# Patient Record
Sex: Male | Born: 1985 | Race: Black or African American | Hispanic: No | Marital: Single | Smoking: Current every day smoker
Health system: Southern US, Community
[De-identification: ages and names within clinical notes are randomized; demographics above are authoritative.]

---

## 2017-07-17 ENCOUNTER — Emergency Department
Admission: EM | Admit: 2017-07-17 | Discharge: 2017-07-17 | Disposition: A | Discharge status: Home / Self Care | Payer: No Typology Code available for payment source | Attending: Emergency Medicine | Admitting: Emergency Medicine

## 2017-07-17 ENCOUNTER — Emergency Department: Payer: No Typology Code available for payment source

## 2017-07-17 ENCOUNTER — Other Ambulatory Visit: Payer: Self-pay

## 2017-07-17 ENCOUNTER — Encounter: Payer: Self-pay | Admitting: Emergency Medicine

## 2017-07-17 DIAGNOSIS — Y92410 Unspecified street and highway as the place of occurrence of the external cause: Secondary | ICD-10-CM | POA: Insufficient documentation

## 2017-07-17 DIAGNOSIS — Y998 Other external cause status: Secondary | ICD-10-CM | POA: Diagnosis not present

## 2017-07-17 DIAGNOSIS — F17200 Nicotine dependence, unspecified, uncomplicated: Secondary | ICD-10-CM | POA: Insufficient documentation

## 2017-07-17 DIAGNOSIS — M545 Low back pain, unspecified: Secondary | ICD-10-CM

## 2017-07-17 DIAGNOSIS — S39012A Strain of muscle, fascia and tendon of lower back, initial encounter: Secondary | ICD-10-CM | POA: Insufficient documentation

## 2017-07-17 DIAGNOSIS — Y939 Activity, unspecified: Secondary | ICD-10-CM | POA: Diagnosis not present

## 2017-07-17 DIAGNOSIS — S3992XA Unspecified injury of lower back, initial encounter: Secondary | ICD-10-CM | POA: Diagnosis present

## 2017-07-17 DIAGNOSIS — T148XXA Other injury of unspecified body region, initial encounter: Secondary | ICD-10-CM

## 2017-07-17 MED ORDER — CYCLOBENZAPRINE HCL 10 MG PO TABS: 10.0000 mg | ORAL_TABLET | Freq: Three times a day (TID) | ORAL | 0 refills | Status: AC | PRN

## 2017-07-17 MED ORDER — NAPROXEN 500 MG PO TABS: 500.0000 mg | ORAL_TABLET | Freq: Two times a day (BID) | ORAL | 0 refills | Status: AC
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
## 2021-10-30 ENCOUNTER — Emergency Department
Admission: EM | Admit: 2021-10-30 | Discharge: 2021-10-30 | Disposition: A | Discharge status: Other Acute Inpatient Hospital | Payer: Medicaid Other | Attending: Emergency Medicine | Admitting: Emergency Medicine

## 2021-10-30 ENCOUNTER — Emergency Department: Payer: Medicaid Other

## 2021-10-30 ENCOUNTER — Other Ambulatory Visit: Payer: Self-pay

## 2021-10-30 DIAGNOSIS — S32691A Other specified fracture of right ischium, initial encounter for closed fracture: Secondary | ICD-10-CM | POA: Diagnosis not present

## 2021-10-30 DIAGNOSIS — Z23 Encounter for immunization: Secondary | ICD-10-CM | POA: Diagnosis not present

## 2021-10-30 DIAGNOSIS — W3400XA Accidental discharge from unspecified firearms or gun, initial encounter: Secondary | ICD-10-CM | POA: Insufficient documentation

## 2021-10-30 DIAGNOSIS — S3991XA Unspecified injury of abdomen, initial encounter: Secondary | ICD-10-CM | POA: Insufficient documentation

## 2021-10-30 DIAGNOSIS — S299XXA Unspecified injury of thorax, initial encounter: Secondary | ICD-10-CM | POA: Insufficient documentation

## 2021-10-30 DIAGNOSIS — S31813A Puncture wound without foreign body of right buttock, initial encounter: Secondary | ICD-10-CM | POA: Diagnosis not present

## 2021-10-30 DIAGNOSIS — Y906 Blood alcohol level of 120-199 mg/100 ml: Secondary | ICD-10-CM | POA: Insufficient documentation

## 2021-10-30 DIAGNOSIS — R791 Abnormal coagulation profile: Secondary | ICD-10-CM | POA: Insufficient documentation

## 2021-10-30 DIAGNOSIS — S71101A Unspecified open wound, right thigh, initial encounter: Secondary | ICD-10-CM | POA: Insufficient documentation

## 2021-10-30 DIAGNOSIS — Z20822 Contact with and (suspected) exposure to covid-19: Secondary | ICD-10-CM | POA: Diagnosis not present

## 2021-10-30 DIAGNOSIS — S31823A Puncture wound without foreign body of left buttock, initial encounter: Secondary | ICD-10-CM | POA: Diagnosis not present

## 2021-10-30 DIAGNOSIS — S80811A Abrasion, right lower leg, initial encounter: Secondary | ICD-10-CM | POA: Diagnosis not present

## 2021-10-30 DIAGNOSIS — Z1810 Retained metal fragments, unspecified: Secondary | ICD-10-CM | POA: Diagnosis not present

## 2021-10-30 DIAGNOSIS — Z043 Encounter for examination and observation following other accident: Secondary | ICD-10-CM | POA: Diagnosis not present

## 2021-10-30 DIAGNOSIS — S32601A Unspecified fracture of right ischium, initial encounter for closed fracture: Secondary | ICD-10-CM | POA: Diagnosis not present

## 2021-10-30 DIAGNOSIS — S79921A Unspecified injury of right thigh, initial encounter: Secondary | ICD-10-CM | POA: Diagnosis present

## 2021-10-30 DIAGNOSIS — S71141A Puncture wound with foreign body, right thigh, initial encounter: Secondary | ICD-10-CM | POA: Diagnosis not present

## 2021-10-30 LAB — COMPREHENSIVE METABOLIC PANEL
ALT: 42 U/L (ref 0–44)
AST: 33 U/L (ref 15–41)
Albumin: 4 g/dL (ref 3.5–5.0)
Alkaline Phosphatase: 60 U/L (ref 38–126)
Anion gap: 15 (ref 5–15)
BUN: 9 mg/dL (ref 6–20)
CO2: 19 mmol/L — ABNORMAL LOW (ref 22–32)
Calcium: 8.8 mg/dL — ABNORMAL LOW (ref 8.9–10.3)
Chloride: 108 mmol/L (ref 98–111)
Creatinine, Ser: 1.22 mg/dL (ref 0.61–1.24)
GFR, Estimated: >60 mL/min (ref >60)
Glucose, Bld: 197 mg/dL — ABNORMAL HIGH (ref 70–99)
Potassium: 3.2 mmol/L — ABNORMAL LOW (ref 3.5–5.1)
Sodium: 142 mmol/L (ref 135–145)
Total Bilirubin: 0.6 mg/dL (ref 0.3–1.2)
Total Protein: 7.4 g/dL (ref 6.5–8.1)

## 2021-10-30 LAB — BPAM FFP
Blood Product Expiration Date: 202310042359
Blood Product Expiration Date: 202310042359
ISSUE DATE / TIME: 202309290238
ISSUE DATE / TIME: 202309290238
Unit Type and Rh: 8400
Unit Type and Rh: 8400

## 2021-10-30 LAB — CBC
HCT: 39.2 % (ref 39.0–52.0)
Hemoglobin: 12.7 g/dL — ABNORMAL LOW (ref 13.0–17.0)
MCH: 31.8 pg (ref 26.0–34.0)
MCHC: 32.4 g/dL (ref 30.0–36.0)
MCV: 98 fL (ref 80.0–100.0)
Platelets: 242 10*3/uL (ref 150–400)
RBC: 4 MIL/uL — ABNORMAL LOW (ref 4.22–5.81)
RDW: 11.9 % (ref 11.5–15.5)
WBC: 7.4 10*3/uL (ref 4.0–10.5)
nRBC: 0 % (ref 0.0–0.2)

## 2021-10-30 LAB — TYPE AND SCREEN
ABO/RH(D): AB POS
Antibody Screen: NEGATIVE
Unit division: 0

## 2021-10-30 LAB — LACTIC ACID, PLASMA
Lactic Acid, Venous: 6.8 mmol/L (ref 0.5–1.9)
Lactic Acid, Venous: 6.4 mmol/L (ref 0.5–1.9)

## 2021-10-30 LAB — PREPARE FRESH FROZEN PLASMA
Unit division: 0
Unit division: 0

## 2021-10-30 LAB — ABO/RH: ABO/RH(D): AB POS

## 2021-10-30 LAB — RESP PANEL BY RT-PCR (FLU A&B, COVID) ARPGX2
Influenza A by PCR: NEGATIVE
Influenza B by PCR: NEGATIVE
SARS Coronavirus 2 by RT PCR: NEGATIVE

## 2021-10-30 LAB — PROTIME-INR
INR: 1.1 (ref 0.8–1.2)
Prothrombin Time: 13.6 seconds (ref 11.4–15.2)

## 2021-10-30 LAB — PREPARE RBC (CROSSMATCH)

## 2021-10-30 LAB — ETHANOL: Alcohol, Ethyl (B): 144 mg/dL — ABNORMAL HIGH (ref <10)

## 2021-10-30 MED ORDER — FENTANYL CITRATE PF 50 MCG/ML IJ SOSY: 100.0000 ug | PREFILLED_SYRINGE | Freq: Once | INTRAMUSCULAR | Status: AC

## 2021-10-30 MED ORDER — FENTANYL CITRATE PF 50 MCG/ML IJ SOSY
PREFILLED_SYRINGE | INTRAMUSCULAR | Status: AC
  Administered 2021-10-30: 100 ug via INTRAVENOUS
  Filled 2021-10-30: qty 2

## 2021-10-30 MED ORDER — CEFAZOLIN SODIUM-DEXTROSE 1-4 GM/50ML-% IV SOLN
1.0000 g | Freq: Once | INTRAVENOUS | Status: DC
  Filled 2021-10-30: qty 50

## 2021-10-30 MED ORDER — TETANUS-DIPHTH-ACELL PERTUSSIS 5-2.5-18.5 LF-MCG/0.5 IM SUSY
PREFILLED_SYRINGE | INTRAMUSCULAR | Status: AC
  Administered 2021-10-30: 0.5 mL via INTRAMUSCULAR
  Filled 2021-10-30: qty 0.5

## 2021-10-30 MED ORDER — NOREPINEPHRINE 4 MG/250ML-% IV SOLN: 0.0000 ug/min | INTRAVENOUS | Status: DC

## 2021-10-30 MED ORDER — FENTANYL CITRATE PF 50 MCG/ML IJ SOSY
100.0000 ug | PREFILLED_SYRINGE | Freq: Once | INTRAMUSCULAR | Status: AC
  Administered 2021-10-30: 100 ug via INTRAVENOUS
  Filled 2021-10-30: qty 2

## 2021-10-30 MED ORDER — NOREPINEPHRINE 4 MG/250ML-% IV SOLN
INTRAVENOUS | Status: AC
  Administered 2021-10-30: 20 ug/min via INTRAVENOUS
  Filled 2021-10-30: qty 250

## 2021-10-30 MED ORDER — SUCCINYLCHOLINE CHLORIDE 200 MG/10ML IV SOSY
PREFILLED_SYRINGE | INTRAVENOUS | Status: AC
  Filled 2021-10-30: qty 10

## 2021-10-30 MED ORDER — ETOMIDATE 2 MG/ML IV SOLN
INTRAVENOUS | Status: AC
  Filled 2021-10-30: qty 10

## 2021-10-30 MED ORDER — IOHEXOL 350 MG/ML SOLN
125.0000 mL | Freq: Once | INTRAVENOUS | Status: AC | PRN
  Administered 2021-10-30: 125 mL via INTRAVENOUS

## 2021-10-30 MED ORDER — SODIUM CHLORIDE 0.9% IV SOLUTION
Freq: Once | INTRAVENOUS | Status: DC
  Filled 2021-10-30: qty 250

## 2021-10-30 MED ORDER — TETANUS-DIPHTH-ACELL PERTUSSIS 5-2.5-18.5 LF-MCG/0.5 IM SUSY: 0.5000 mL | PREFILLED_SYRINGE | Freq: Once | INTRAMUSCULAR | Status: AC
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
## 2021-10-30 NOTE — ED Notes (Signed)
Issue with scanning the FFP - barcodes not matching. Charge RN, Dr. Jacelyn Grip and blood bank aware.
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
## 2021-10-30 NOTE — ED Notes (Signed)
Transported to ct 

## 2021-10-30 NOTE — ED Notes (Signed)
Dr Jacelyn Grip informed of lactic acid of 6.8
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
## 2021-10-30 NOTE — ED Notes (Signed)
Tourniqet applied to right upper thigh by dr Jacelyn Grip

## 2021-10-30 NOTE — ED Notes (Signed)
HR decreased to 45, zoll pads applied and pt currently being monitored by zoll. + carotid pulse. Pt is very diaphoretic

## 2021-10-30 NOTE — ED Notes (Signed)
FAST EXAM NEGATIVE

## 2021-10-31 DIAGNOSIS — S8991XA Unspecified injury of right lower leg, initial encounter: Secondary | ICD-10-CM | POA: Diagnosis not present

## 2021-10-31 DIAGNOSIS — R937 Abnormal findings on diagnostic imaging of other parts of musculoskeletal system: Secondary | ICD-10-CM | POA: Diagnosis not present

## 2021-10-31 DIAGNOSIS — S32691A Other specified fracture of right ischium, initial encounter for closed fracture: Secondary | ICD-10-CM | POA: Diagnosis not present

## 2021-10-31 DIAGNOSIS — S31819A Unspecified open wound of right buttock, initial encounter: Secondary | ICD-10-CM | POA: Diagnosis not present

## 2021-10-31 DIAGNOSIS — S71141A Puncture wound with foreign body, right thigh, initial encounter: Secondary | ICD-10-CM | POA: Diagnosis not present

## 2021-10-31 DIAGNOSIS — S31813A Puncture wound without foreign body of right buttock, initial encounter: Secondary | ICD-10-CM | POA: Diagnosis not present

## 2021-11-01 LAB — BPAM RBC
Blood Product Expiration Date: 202311042359
Blood Product Expiration Date: 202311042359
ISSUE DATE / TIME: 202309290135
ISSUE DATE / TIME: 202309290135
Unit Type and Rh: 5100
Unit Type and Rh: 5100

## 2021-11-01 LAB — TYPE AND SCREEN: Unit division: 0

## 2021-11-02 DIAGNOSIS — F17200 Nicotine dependence, unspecified, uncomplicated: Secondary | ICD-10-CM | POA: Insufficient documentation

## 2021-11-04 ENCOUNTER — Ambulatory Visit: Payer: Self-pay | Admitting: *Deleted
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
## 2022-03-02 ENCOUNTER — Ambulatory Visit: Payer: Medicaid Other | Admitting: Physician Assistant

## 2022-04-08 ENCOUNTER — Ambulatory Visit: Payer: Medicaid Other | Admitting: Physician Assistant

## 2022-04-08 NOTE — Progress Notes (Deleted)
   Argentina Ponder Earmon Sherrow,acting as a Education administrator for [employer], PA-C.,have documented all relevant documentation on the behalf of Mardene Speak, PA-C,as directed by  [employer], PA-C while in the presence of [employer], PA-C.   New patient visit   Patient: Alex Blair   DOB: 11-05-1985   37 y.o. Male  MRN: [id number] Visit Date: 04/08/2022  Today's healthcare provider: Mardene Speak, PA-C   No chief complaint on file.  Subjective    Alex Blair is a 37 y.o. male who presents today as a new patient to establish care.  HPI  ***  No past medical history on file. No past surgical history on file. No family status information on file.   No family history on file. Social History   Socioeconomic History   Marital status: Single    Spouse name: Not on file   Number of children: Not on file   Years of education: Not on file   Highest education level: Not on file  Occupational History   Not on file  Tobacco Use   Smoking status: Every Day   Smokeless tobacco: Never  Substance and Sexual Activity   Alcohol use: Not Currently   Drug use: Not on file   Sexual activity: Not on file  Other Topics Concern   Not on file  Social History Narrative   Not on file   Social Determinants of Health   Financial Resource Strain: Not on file  Food Insecurity: Not on file  Transportation Needs: Not on file  Physical Activity: Not on file  Stress: Not on file  Social Connections: Not on file   Outpatient Medications Prior to Visit  Medication Sig   cyclobenzaprine (FLEXERIL) 10 MG tablet Take 1 tablet (10 mg total) by mouth 3 (three) times daily as needed for muscle spasms.   naproxen (NAPROSYN) 500 MG tablet Take 1 tablet (500 mg total) by mouth 2 (two) times daily with a meal.   No facility-administered medications prior to visit.   No Known Allergies  Immunization History  Administered Date(s) Administered   Tdap 10/30/2021    Health Maintenance  Topic Date Due    COVID-19 Vaccine (1) Never done   HIV Screening  Never done   Hepatitis C Screening  Never done   INFLUENZA VACCINE  Never done   DTaP/Tdap/Td (2 - Td or Tdap) 10/31/2031   HPV VACCINES  Aged Out    Patient Care Team: Pcp, No as PCP - General  Review of Systems  Constitutional: Negative.   HENT: Negative.    Eyes: Negative.   Respiratory: Negative.    Cardiovascular: Negative.   Gastrointestinal: Negative.   Endocrine: Negative.   Genitourinary: Negative.   Musculoskeletal: Negative.   Skin: Negative.   Allergic/Immunologic: Negative.   Neurological: Negative.   Hematological: Negative.   Psychiatric/Behavioral: Negative.      {Labs  Heme  Chem  Endocrine  Serology  Results Review (optional):23779}   Objective    There were no vitals taken for this visit. {Show previous vital signs (optional):23777}  Physical Exam ***  Depression Screen     No data to display         No results found for any visits on 04/08/22.  Assessment & Plan     ***  No follow-ups on file.     {provider attestation***:1}   Mardene Speak, PA-C  [place] [phone] (phone) [phone] (fax)  [place]

## 2023-04-12 ENCOUNTER — Ambulatory Visit: Payer: Medicaid Other | Admitting: Pediatrics

## 2023-07-26 ENCOUNTER — Ambulatory Visit: Admitting: Pediatrics

## 2023-08-06 NOTE — Patient Instructions (Incomplete)

## 2023-08-15 ENCOUNTER — Ambulatory Visit: Admitting: Nurse Practitioner

## 2023-10-30 NOTE — Patient Instructions (Incomplete)
 Healthy Eating, Adult Healthy eating may help you get and keep a healthy body weight, reduce the risk of chronic disease, and live a long and productive life. It is important to follow a healthy eating pattern. Your nutritional and calorie needs should be met mainly by different nutrient-rich foods. What are tips for following this plan? Reading food labels Read labels and choose the following: Reduced or low sodium products. Juices with 100% fruit juice. Foods with low saturated fats (<3 g per serving) and high polyunsaturated and monounsaturated fats. Foods with whole grains, such as whole wheat, cracked wheat, brown rice, and wild rice. Whole grains that are fortified with folic acid . This is recommended for females who are pregnant or who want to become pregnant. Read labels and do not eat or drink the following: Foods or drinks with added sugars. These include foods that contain brown sugar, corn sweetener, corn syrup, dextrose , fructose, glucose, high-fructose corn syrup, honey, invert sugar, lactose, malt syrup, maltose, molasses, raw sugar, sucrose, trehalose, or turbinado sugar. Limit your intake of added sugars to less than 10% of your total daily calories. Do not eat more than the following amounts of added sugar per day: 6 teaspoons (25 g) for females. 9 teaspoons (38 g) for males. Foods that contain processed or refined starches and grains. Refined grain products, such as white flour, degermed cornmeal, white bread, and white rice. Shopping Choose nutrient-rich snacks, such as vegetables, whole fruits, and nuts. Avoid high-calorie and high-sugar snacks, such as potato chips, fruit snacks, and candy. Use oil-based dressings and spreads on foods instead of solid fats such as butter, margarine, sour cream, or cream cheese. Limit pre-made sauces, mixes, and instant products such as flavored rice, instant noodles, and ready-made pasta. Try more plant-protein sources, such as tofu,  tempeh, black beans, edamame, lentils, nuts, and seeds. Explore eating plans such as the Mediterranean diet or vegetarian diet. Try heart-healthy dips made with beans and healthy fats like hummus and guacamole. Vegetables go great with these. Cooking Use oil to saut or stir-fry foods instead of solid fats such as butter, margarine, or lard. Try baking, boiling, grilling, or broiling instead of frying. Remove the fatty part of meats before cooking. Steam vegetables in water  or broth. Meal planning  At meals, imagine dividing your plate into fourths: One-half of your plate is fruits and vegetables. One-fourth of your plate is whole grains. One-fourth of your plate is protein, especially lean meats, poultry, eggs, tofu, beans, or nuts. Include low-fat dairy as part of your daily diet. Lifestyle Choose healthy options in all settings, including home, work, school, restaurants, or stores. Prepare your food safely: Wash your hands after handling raw meats. Where you prepare food, keep surfaces clean by regularly washing with hot, soapy water . Keep raw meats separate from ready-to-eat foods, such as fruits and vegetables. Cook seafood, meat, poultry, and eggs to the recommended temperature. Get a food thermometer. Store foods at safe temperatures. In general: Keep cold foods at 34F (4.4C) or below. Keep hot foods at 134F (60C) or above. Keep your freezer at [facility] (-17.8C) or below. Foods are not safe to eat if they have been between the temperatures of 40-134F (4.4-60C) for more than 2 hours. What foods should I eat? Fruits Aim to eat 1-2 cups of fresh, canned (in natural juice), or frozen fruits each day. One cup of fruit equals 1 small apple, 1 large banana, 8 large strawberries, 1 cup (237 g) canned fruit,  cup (82 g) dried fruit,  or 1 cup (240 mL) 100% juice. Vegetables Aim to eat 2-4 cups of fresh and frozen vegetables each day, including different varieties and colors. One cup  of vegetables equals 1 cup (91 g) broccoli or cauliflower florets, 2 medium carrots, 2 cups (150 g) raw, leafy greens, 1 large tomato, 1 large bell pepper, 1 large sweet potato, or 1 medium white potato. Grains Aim to eat 5-10 ounce-equivalents of whole grains each day. Examples of 1 ounce-equivalent of grains include 1 slice of bread, 1 cup (40 g) ready-to-eat cereal, 3 cups (24 g) popcorn, or  cup (93 g) cooked rice. Meats and other proteins Try to eat 5-7 ounce-equivalents of protein each day. Examples of 1 ounce-equivalent of protein include 1 egg,  oz nuts (12 almonds, 24 pistachios, or 7 walnut halves), 1/4 cup (90 g) cooked beans, 6 tablespoons (90 g) hummus or 1 tablespoon (16 g) peanut butter. A cut of meat or fish that is the size of a deck of cards is about 3-4 ounce-equivalents (85 g). Of the protein you eat each week, try to have at least 8 sounce (227 g) of seafood. This is about 2 servings per week. This includes salmon, trout, herring, sardines, and anchovies. Dairy Aim to eat 3 cup-equivalents of fat-free or low-fat dairy each day. Examples of 1 cup-equivalent of dairy include 1 cup (240 mL) milk, 8 ounces (250 g) yogurt, 1 ounces (44 g) natural cheese, or 1 cup (240 mL) fortified soy milk. Fats and oils Aim for about 5 teaspoons (21 g) of fats and oils per day. Choose monounsaturated fats, such as canola and olive oils, mayonnaise made with olive oil or avocado oil, avocados, peanut butter, and most nuts, or polyunsaturated fats, such as sunflower, corn, and soybean oils, walnuts, pine nuts, sesame seeds, sunflower seeds, and flaxseed. Beverages Aim for 6 eight-ounce glasses of water  per day. Limit coffee to 3-5 eight-ounce cups per day. Limit caffeinated beverages that have added calories, such as soda and energy drinks. If you drink alcohol: Limit how much you have to: 0-1 drink a day if you are male. 0-2 drinks a day if you are male. Know how much alcohol is in your drink.  In the [place] one drink is one 12 oz bottle of beer (355 mL), one 5 oz glass of wine (148 mL), or one 1 oz glass of hard liquor (44 mL). Seasoning and other foods Try not to add too much salt to your food. Try using herbs and spices instead of salt. Try not to add sugar to food. This information is based on [place] nutrition guidelines. To learn more, visit [employer]. Exact amounts may vary. You may need different amounts. This information is not intended to replace advice given to you by your health care provider. Make sure you discuss any questions you have with your health care provider. Document Revised: 10/19/2021 Document Reviewed: 10/19/2021 [employer] Patient Education  2024 [employer].

## 2023-11-02 ENCOUNTER — Ambulatory Visit: Admitting: Nurse Practitioner

## 2023-12-16 NOTE — Patient Instructions (Incomplete)
 Healthy Eating, Adult Healthy eating may help you get and keep a healthy body weight, reduce the risk of chronic disease, and live a long and productive life. It is important to follow a healthy eating pattern. Your nutritional and calorie needs should be met mainly by different nutrient-rich foods. What are tips for following this plan? Reading food labels Read labels and choose the following: Reduced or low sodium products. Juices with 100% fruit juice. Foods with low saturated fats (<3 g per serving) and high polyunsaturated and monounsaturated fats. Foods with whole grains, such as whole wheat, cracked wheat, brown rice, and wild rice. Whole grains that are fortified with folic acid . This is recommended for females who are pregnant or who want to become pregnant. Read labels and do not eat or drink the following: Foods or drinks with added sugars. These include foods that contain brown sugar, corn sweetener, corn syrup, dextrose , fructose, glucose, high-fructose corn syrup, honey, invert sugar, lactose, malt syrup, maltose, molasses, raw sugar, sucrose, trehalose, or turbinado sugar. Limit your intake of added sugars to less than 10% of your total daily calories. Do not eat more than the following amounts of added sugar per day: 6 teaspoons (25 g) for females. 9 teaspoons (38 g) for males. Foods that contain processed or refined starches and grains. Refined grain products, such as white flour, degermed cornmeal, white bread, and white rice. Shopping Choose nutrient-rich snacks, such as vegetables, whole fruits, and nuts. Avoid high-calorie and high-sugar snacks, such as potato chips, fruit snacks, and candy. Use oil-based dressings and spreads on foods instead of solid fats such as butter, margarine, sour cream, or cream cheese. Limit pre-made sauces, mixes, and instant products such as flavored rice, instant noodles, and ready-made pasta. Try more plant-protein sources, such as tofu,  tempeh, black beans, edamame, lentils, nuts, and seeds. Explore eating plans such as the Mediterranean diet or vegetarian diet. Try heart-healthy dips made with beans and healthy fats like hummus and guacamole. Vegetables go great with these. Cooking Use oil to saut or stir-fry foods instead of solid fats such as butter, margarine, or lard. Try baking, boiling, grilling, or broiling instead of frying. Remove the fatty part of meats before cooking. Steam vegetables in water  or broth. Meal planning  At meals, imagine dividing your plate into fourths: One-half of your plate is fruits and vegetables. One-fourth of your plate is whole grains. One-fourth of your plate is protein, especially lean meats, poultry, eggs, tofu, beans, or nuts. Include low-fat dairy as part of your daily diet. Lifestyle Choose healthy options in all settings, including home, work, school, restaurants, or stores. Prepare your food safely: Wash your hands after handling raw meats. Where you prepare food, keep surfaces clean by regularly washing with hot, soapy water . Keep raw meats separate from ready-to-eat foods, such as fruits and vegetables. Cook seafood, meat, poultry, and eggs to the recommended temperature. Get a food thermometer. Store foods at safe temperatures. In general: Keep cold foods at 34F (4.4C) or below. Keep hot foods at 134F (60C) or above. Keep your freezer at [facility] (-17.8C) or below. Foods are not safe to eat if they have been between the temperatures of 40-134F (4.4-60C) for more than 2 hours. What foods should I eat? Fruits Aim to eat 1-2 cups of fresh, canned (in natural juice), or frozen fruits each day. One cup of fruit equals 1 small apple, 1 large banana, 8 large strawberries, 1 cup (237 g) canned fruit,  cup (82 g) dried fruit,  or 1 cup (240 mL) 100% juice. Vegetables Aim to eat 2-4 cups of fresh and frozen vegetables each day, including different varieties and colors. One cup  of vegetables equals 1 cup (91 g) broccoli or cauliflower florets, 2 medium carrots, 2 cups (150 g) raw, leafy greens, 1 large tomato, 1 large bell pepper, 1 large sweet potato, or 1 medium white potato. Grains Aim to eat 5-10 ounce-equivalents of whole grains each day. Examples of 1 ounce-equivalent of grains include 1 slice of bread, 1 cup (40 g) ready-to-eat cereal, 3 cups (24 g) popcorn, or  cup (93 g) cooked rice. Meats and other proteins Try to eat 5-7 ounce-equivalents of protein each day. Examples of 1 ounce-equivalent of protein include 1 egg,  oz nuts (12 almonds, 24 pistachios, or 7 walnut halves), 1/4 cup (90 g) cooked beans, 6 tablespoons (90 g) hummus or 1 tablespoon (16 g) peanut butter. A cut of meat or fish that is the size of a deck of cards is about 3-4 ounce-equivalents (85 g). Of the protein you eat each week, try to have at least 8 sounce (227 g) of seafood. This is about 2 servings per week. This includes salmon, trout, herring, sardines, and anchovies. Dairy Aim to eat 3 cup-equivalents of fat-free or low-fat dairy each day. Examples of 1 cup-equivalent of dairy include 1 cup (240 mL) milk, 8 ounces (250 g) yogurt, 1 ounces (44 g) natural cheese, or 1 cup (240 mL) fortified soy milk. Fats and oils Aim for about 5 teaspoons (21 g) of fats and oils per day. Choose monounsaturated fats, such as canola and olive oils, mayonnaise made with olive oil or avocado oil, avocados, peanut butter, and most nuts, or polyunsaturated fats, such as sunflower, corn, and soybean oils, walnuts, pine nuts, sesame seeds, sunflower seeds, and flaxseed. Beverages Aim for 6 eight-ounce glasses of water  per day. Limit coffee to 3-5 eight-ounce cups per day. Limit caffeinated beverages that have added calories, such as soda and energy drinks. If you drink alcohol: Limit how much you have to: 0-1 drink a day if you are male. 0-2 drinks a day if you are male. Know how much alcohol is in your drink.  In the [place] one drink is one 12 oz bottle of beer (355 mL), one 5 oz glass of wine (148 mL), or one 1 oz glass of hard liquor (44 mL). Seasoning and other foods Try not to add too much salt to your food. Try using herbs and spices instead of salt. Try not to add sugar to food. This information is based on [place] nutrition guidelines. To learn more, visit [employer]. Exact amounts may vary. You may need different amounts. This information is not intended to replace advice given to you by your health care provider. Make sure you discuss any questions you have with your health care provider. Document Revised: 10/19/2021 Document Reviewed: 10/19/2021 [employer] Patient Education  2024 [employer].

## 2023-12-21 ENCOUNTER — Ambulatory Visit: Admitting: Nurse Practitioner
